# Patient Record
Sex: Male | Born: 1960 | Race: Black or African American | Hispanic: No | Marital: Married | Smoking: Former smoker
Health system: Southern US, Community
[De-identification: ages and names within clinical notes are randomized; demographics above are authoritative.]

## PROBLEM LIST (undated history)

## (undated) DIAGNOSIS — G4730 Sleep apnea, unspecified: Secondary | ICD-10-CM

## (undated) DIAGNOSIS — J45909 Unspecified asthma, uncomplicated: Secondary | ICD-10-CM

## (undated) DIAGNOSIS — M1990 Unspecified osteoarthritis, unspecified site: Secondary | ICD-10-CM

## (undated) DIAGNOSIS — E119 Type 2 diabetes mellitus without complications: Secondary | ICD-10-CM

## (undated) DIAGNOSIS — I10 Essential (primary) hypertension: Secondary | ICD-10-CM

## (undated) HISTORY — PX: TOTAL HIP ARTHROPLASTY: SHX124

## (undated) HISTORY — PX: NASAL POLYP SURGERY: SHX186

---

## 1999-08-25 ENCOUNTER — Ambulatory Visit (HOSPITAL_BASED_OUTPATIENT_CLINIC_OR_DEPARTMENT_OTHER): Admission: RE | Admit: 1999-08-25 | Discharge: 1999-08-25 | Payer: Self-pay | Admitting: *Deleted

## 2015-06-29 ENCOUNTER — Other Ambulatory Visit: Payer: Self-pay | Admitting: Orthopedic Surgery

## 2015-06-29 DIAGNOSIS — M542 Cervicalgia: Secondary | ICD-10-CM

## 2015-07-07 ENCOUNTER — Ambulatory Visit
Admission: RE | Admit: 2015-07-07 | Discharge: 2015-07-07 | Disposition: A | Discharge status: Home / Self Care | Payer: BLUE CROSS/BLUE SHIELD | Source: Ambulatory Visit | Attending: Orthopedic Surgery | Admitting: Orthopedic Surgery

## 2015-07-07 DIAGNOSIS — M542 Cervicalgia: Secondary | ICD-10-CM

## 2015-07-23 ENCOUNTER — Ambulatory Visit
Admission: RE | Admit: 2015-07-23 | Discharge: 2015-07-23 | Disposition: A | Discharge status: Home / Self Care | Payer: BLUE CROSS/BLUE SHIELD | Source: Ambulatory Visit | Attending: Orthopedic Surgery | Admitting: Orthopedic Surgery
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
## 2017-02-06 ENCOUNTER — Ambulatory Visit (INDEPENDENT_AMBULATORY_CARE_PROVIDER_SITE_OTHER): Payer: Non-veteran care

## 2017-02-06 ENCOUNTER — Encounter: Payer: Self-pay | Admitting: Sports Medicine

## 2017-02-06 ENCOUNTER — Ambulatory Visit (INDEPENDENT_AMBULATORY_CARE_PROVIDER_SITE_OTHER): Payer: Non-veteran care | Admitting: Sports Medicine

## 2017-02-06 VITALS — BP 139/76 | HR 68 | Ht 68.0 in | Wt 215.0 lb

## 2017-02-06 DIAGNOSIS — M79672 Pain in left foot: Secondary | ICD-10-CM

## 2017-02-06 DIAGNOSIS — M79671 Pain in right foot: Secondary | ICD-10-CM | POA: Diagnosis not present

## 2017-02-06 DIAGNOSIS — M2010 Hallux valgus (acquired), unspecified foot: Secondary | ICD-10-CM

## 2017-02-06 NOTE — Patient Instructions (Signed)

## 2017-02-06 NOTE — Progress Notes (Signed)
Subjective: Colin Elliott is a 56 y.o. male patient who presents to office for evaluation of Right> Left bunion pain sometimes. Patient complains of progressive pain especially over the last year in the Right>Left foot that starts as pain over the bump with direct pressure and range of motion; patient now has difficulty fitting shoes comfortably however he purchase orthotics from the good foot store that has greatly helped and when he wears those orthotics he seldom has pain. Patient denies any other pedal complaints.   Review of Systems  Musculoskeletal: Positive for joint pain.  All other systems reviewed and are negative.   There are no active problems to display for this patient.   No current outpatient medications on file prior to visit.   No current facility-administered medications on file prior to visit.     No Known Allergies  Objective:  General: Alert and oriented x3 in no acute distress  Dermatology: No open lesions bilateral lower extremities, no webspace macerations, no ecchymosis bilateral, all nails x 10 are well manicured.  There are mild pinch calluses medial aspect of hallux bilateral.  Vascular: Dorsalis Pedis and Posterior Tibial pedal pulses 1/4, Capillary Fill Time 3 seconds, (+) pedal hair growth bilateral, no edema bilateral lower extremities, Temperature gradient within normal limits.  Neurology: Johney Maine sensation intact via light touch bilateral.  Musculoskeletal: No reproducible tenderness tenderness with palpation right or left bunion deformity,deformity semireducible, tracking not trackbound, On weightbearing exam, there is decreased 1st MTPJ rom Right>Left with functional limitus noted, there is medial arch collapse Right> Left on weightbearing, rearfoot slight valgus, forefoot slight abduction with HAV deformity supported on ground with no second toe crossover deformity noted.   Xrays  Right/Left Foot    Impression: Intermetatarsal angle above  normal limits consistent with bunion deformity no other acute findings.      Assessment and Plan: Problem List Items Addressed This Visit    None    Visit Diagnoses    Valgus deformity of great toe, unspecified laterality    -  Primary   Relevant Orders   DG Foot Complete Right   DG Foot Complete Left   Foot pain, bilateral           -Complete examination performed -Xrays reviewed -Discussed treatement options; discussed HAV deformity;conservative and  Surgical management; risks, benefits, alternatives discussed. All patient's questions answered. -At this time patient does not have symptoms when he is wearing good feet orthotics encouraged patient to continue with good supportive shoes and orthotics -Dispensed silicone bunion shields to use to protect any rubbing or irritation over the bumps -Advised patient if this fails to help with his symptoms then may benefit from considering anti-inflammatory or a steroid injection across the joints versus surgery -Patient to return to office as needed or sooner if condition worsens.  Landis Martins, DPM

## 2017-03-27 ENCOUNTER — Telehealth: Payer: Self-pay | Admitting: Sports Medicine

## 2017-03-27 NOTE — Telephone Encounter (Signed)
[facility] would like notes from pts 02/06/17 visit with Dr. Cannon Kettle in [place]. Fax to [employer] [phone].

## 2017-03-27 NOTE — Telephone Encounter (Signed)
Thank you. I will go ahead and get this taken care of today.

## 2017-04-29 ENCOUNTER — Other Ambulatory Visit: Payer: Self-pay | Admitting: Sports Medicine

## 2017-04-29 DIAGNOSIS — M79671 Pain in right foot: Secondary | ICD-10-CM

## 2017-04-29 DIAGNOSIS — M2010 Hallux valgus (acquired), unspecified foot: Secondary | ICD-10-CM

## 2017-04-29 DIAGNOSIS — M79672 Pain in left foot: Secondary | ICD-10-CM

## 2020-12-08 ENCOUNTER — Other Ambulatory Visit: Payer: Self-pay | Admitting: General Surgery

## 2020-12-27 NOTE — Pre-Procedure Instructions (Addendum)
Surgical Instructions    Your procedure is scheduled on Wednesday 01/03/21.   Report to [facility] Main Entrance "A" at 08:30 A.M., then check in with the Admitting office.  Call this number if you have problems the morning of surgery:  [phone]   If you have any questions prior to your surgery date call [phone]: Open Monday-Friday 8am-4pm    Remember:  Do not eat after midnight the night before your surgery  You may drink clear liquids until 07:30 A.M. the morning of your surgery.   Clear liquids allowed are: Water, Non-Citrus Juices (without pulp), Carbonated Beverages, Clear Tea, Black Coffee ONLY (NO MILK, CREAM OR POWDERED CREAMER of any kind), and Gatorade   Patient Instructions  The night before surgery:  No food after midnight. ONLY clear liquids after midnight   The day of surgery (if you have diabetes): Drink ONE (1) 12 oz G2 given to you in your pre admission testing appointment by 07:30 A.M. the morning of surgery. Drink in one sitting. Do not sip.  This drink was given to you during your hospital  pre-op appointment visit.  Nothing else to drink after completing the  12 oz bottle of G2.         If you have questions, please contact your surgeon's office.   Take these medicines the morning of surgery with A SIP OF WATER   amLODipine (NORVASC)  fluticasone (FLONASE)  metoprolol tartrate (LOPRESSOR)   Take these medicines if needed:   hydroxypropyl methylcellulose / hypromellose (ISOPTO TEARS   As of today, STOP taking any Aspirin (unless otherwise instructed by your surgeon) Aleve, Naproxen, Ibuprofen, Motrin, Advil, Goody's, BC's, all herbal medications, fish oil, and all vitamins.     After your COVID test   You are not required to quarantine however you are required to wear a well-fitting mask when you are out and around people not in your household.  If your mask becomes wet or soiled, replace with a new one.  Wash your hands often with soap  and water for 20 seconds or clean your hands with an alcohol-based hand sanitizer that contains at least 60% alcohol.  Do not share personal items.  Notify your provider: if you are in close contact with someone who has COVID  or if you develop a fever of 100.4 or greater, sneezing, cough, sore throat, shortness of breath or body aches.             Do not wear jewelry or makeup Do not wear lotions, powders, perfumes/colognes, or deodorant. Do not shave 48 hours prior to surgery.  Men may shave face and neck. Do not bring valuables to the hospital. DO Not wear nail polish, gel polish, artificial nails, or any other type of covering on natural nails including finger and toenails. If patients have artificial nails, gel coating, etc. that need to be removed by a nail salon, please have this removed prior to surgery or surgery may need to be canceled/delayed if the surgeon/ anesthesia feels like the patient is unable to be adequately monitored.             [facility] is not responsible for any belongings or valuables.  Do NOT Smoke (Tobacco/Vaping)  24 hours prior to your procedure  If you use a CPAP at night, you may bring your mask for your overnight stay.   Contacts, glasses, hearing aids, dentures or partials may not be worn into surgery, please bring cases for these belongings  For patients admitted to the hospital, discharge time will be determined by your treatment team.   Patients discharged the day of surgery will not be allowed to drive home, and someone needs to stay with them for 24 hours.  NO VISITORS WILL BE ALLOWED IN PRE-OP WHERE PATIENTS ARE PREPPED FOR SURGERY.  ONLY 1 SUPPORT PERSON MAY BE PRESENT IN THE WAITING ROOM WHILE YOU ARE IN SURGERY.  IF YOU ARE TO BE ADMITTED, ONCE YOU ARE IN YOUR ROOM YOU WILL BE ALLOWED TWO (2) VISITORS. 1 (ONE) VISITOR MAY STAY OVERNIGHT BUT MUST ARRIVE TO THE ROOM BY 8pm.  Minor children may have two parents present. Special consideration  for safety and communication needs will be reviewed on a case by case basis.  Special instructions:    Oral Hygiene is also important to reduce your risk of infection.  Remember - BRUSH YOUR TEETH THE MORNING OF SURGERY WITH YOUR REGULAR TOOTHPASTE   [place]- Preparing For Surgery  Before surgery, you can play an important role. Because skin is not sterile, your skin needs to be as free of germs as possible. You can reduce the number of germs on your skin by washing with CHG (chlorahexidine gluconate) Soap before surgery.  CHG is an antiseptic cleaner which kills germs and bonds with the skin to continue killing germs even after washing.     Please do not use if you have an allergy to CHG or antibacterial soaps. If your skin becomes reddened/irritated stop using the CHG.  Do not shave (including legs and underarms) for at least 48 hours prior to first CHG shower. It is OK to shave your face.  Please follow these instructions carefully.     Shower the NIGHT BEFORE SURGERY and the MORNING OF SURGERY with CHG Soap.   If you chose to wash your hair, wash your hair first as usual with your normal shampoo. After you shampoo, rinse your hair and body thoroughly to remove the shampoo.  Then [employer] and genitals (private parts) with your normal soap and rinse thoroughly to remove soap.  After that Use CHG Soap as you would any other liquid soap. You can apply CHG directly to the skin and wash gently with a scrungie or a clean washcloth.   Apply the CHG Soap to your body ONLY FROM THE NECK DOWN.  Do not use on open wounds or open sores. Avoid contact with your eyes, ears, mouth and genitals (private parts). Wash Face and genitals (private parts)  with your normal soap.   Wash thoroughly, paying special attention to the area where your surgery will be performed.  Thoroughly rinse your body with warm water from the neck down.  DO NOT shower/wash with your normal soap after using and rinsing  off the CHG Soap.  Pat yourself dry with a CLEAN TOWEL.  Wear CLEAN PAJAMAS to bed the night before surgery  Place CLEAN SHEETS on your bed the night before your surgery  DO NOT SLEEP WITH PETS.   Day of Surgery:  Take a shower with CHG soap. Wear Clean/Comfortable clothing the morning of surgery Do not apply any deodorants/lotions.   Remember to brush your teeth WITH YOUR REGULAR TOOTHPASTE.   Please read over the following fact sheets that you were given.

## 2020-12-28 ENCOUNTER — Other Ambulatory Visit: Payer: Self-pay

## 2020-12-28 ENCOUNTER — Encounter (HOSPITAL_COMMUNITY)
Admission: RE | Admit: 2020-12-28 | Discharge: 2020-12-28 | Disposition: A | Discharge status: Home / Self Care | Payer: No Typology Code available for payment source | Source: Ambulatory Visit | Attending: General Surgery | Admitting: General Surgery

## 2020-12-28 ENCOUNTER — Encounter (HOSPITAL_COMMUNITY): Payer: Self-pay

## 2020-12-28 VITALS — BP 172/89 | HR 81 | Temp 98.4°F | Resp 17 | Ht 68.0 in | Wt 236.6 lb

## 2020-12-28 DIAGNOSIS — Z01818 Encounter for other preprocedural examination: Secondary | ICD-10-CM | POA: Diagnosis not present

## 2020-12-28 DIAGNOSIS — I2510 Atherosclerotic heart disease of native coronary artery without angina pectoris: Secondary | ICD-10-CM | POA: Insufficient documentation

## 2020-12-28 DIAGNOSIS — R7303 Prediabetes: Secondary | ICD-10-CM | POA: Diagnosis not present

## 2020-12-28 HISTORY — DX: Unspecified asthma, uncomplicated: J45.909

## 2020-12-28 HISTORY — DX: Type 2 diabetes mellitus without complications: E11.9

## 2020-12-28 HISTORY — DX: Essential (primary) hypertension: I10

## 2020-12-28 HISTORY — DX: Unspecified osteoarthritis, unspecified site: M19.90

## 2020-12-28 HISTORY — DX: Sleep apnea, unspecified: G47.30

## 2020-12-28 LAB — CBC
HCT: 41.9 % (ref 39.0–52.0)
Hemoglobin: 13.3 g/dL (ref 13.0–17.0)
MCH: 25.9 pg — ABNORMAL LOW (ref 26.0–34.0)
MCHC: 31.7 g/dL (ref 30.0–36.0)
MCV: 81.7 fL (ref 80.0–100.0)
Platelets: 318 10*3/uL (ref 150–400)
RBC: 5.13 MIL/uL (ref 4.22–5.81)
RDW: 14.8 % (ref 11.5–15.5)
WBC: 5.9 10*3/uL (ref 4.0–10.5)
nRBC: 0 % (ref 0.0–0.2)

## 2020-12-28 LAB — BASIC METABOLIC PANEL
Anion gap: 9 (ref 5–15)
BUN: 13 mg/dL (ref 6–20)
CO2: 31 mmol/L (ref 22–32)
Calcium: 9.3 mg/dL (ref 8.9–10.3)
Chloride: 99 mmol/L (ref 98–111)
Creatinine, Ser: 1 mg/dL (ref 0.61–1.24)
GFR, Estimated: >60 mL/min (ref >60)
Glucose, Bld: 133 mg/dL — ABNORMAL HIGH (ref 70–99)
Potassium: 3.2 mmol/L — ABNORMAL LOW (ref 3.5–5.1)
Sodium: 139 mmol/L (ref 135–145)

## 2020-12-28 LAB — HEMOGLOBIN A1C
Hgb A1c MFr Bld: 6.4 % — ABNORMAL HIGH (ref 4.8–5.6)
Mean Plasma Glucose: 136.98 mg/dL

## 2020-12-28 LAB — GLUCOSE, CAPILLARY: Glucose-Capillary: 162 mg/dL — ABNORMAL HIGH (ref 70–99)

## 2020-12-28 NOTE — Progress Notes (Signed)
PCP - [place] various doctors Cardiologist - [facility]  PPM/ICD - n/a Device Orders - n/a Rep Notified - n/a  Chest x-ray - n/a EKG - 12/28/20 Stress Test - denies ECHO - patient denies Cardiac Cath - patient denies  Sleep Study - Many years ago per patient at the [facility] CPAP - Wears nightly.   CBG today= 162 Checks Blood Sugar zero times a day- Does not check blood sugar. Per patient he has pre-diabetes.   Blood Thinner Instructions: n/a Aspirin Instructions: As of today stop taking Aspirin unless otherwise instructed by your surgeon.   ERAS Protcol - Yes PRE-SURGERY Ensure or G2- G2  COVID TEST- No. Ambulatory Surgery   Anesthesia review: Yes. EKG review and records requested from the [place].   Patient denies shortness of breath, fever, cough and chest pain at PAT appointment   All instructions explained to the patient, with a verbal understanding of the material. Patient agrees to go over the instructions while at home for a better understanding. Patient also instructed to self quarantine after being tested for COVID-19. The opportunity to ask questions was provided.

## 2020-12-29 NOTE — Progress Notes (Signed)
Anesthesia Chart Review:  Case: [id number] Date/Time: 01/03/21 1015   Procedure: EXCISION OF LEFT ARM MASS (Left)   Anesthesia type: Monitor Anesthesia Care   Pre-op diagnosis: LEFT ARM MASS   Location: [facility] OR ROOM 02 / [facility] OR   Surgeons: Stark Klein, MD       DISCUSSION: Patient is a 60 year old male scheduled for the above procedure.  History includes former smoker, HTN, DM2, asthma, chronic sinusitis (s/p nasal polyp surgery), OSA (uses CPAP), arthritis, PTSD, right THA (2000). He says he will need right THA revision in the near future (not scheduled yet).  I called and spoke with him since PAT RN documented that has a cardiologist at the [facility]. He reported that "years ago" he had some friends with heart issues, so he asked for his heart to be checked out. He said that based on the testing, he was told he might have had a silent MI. 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 He thinks he went on to have an exercise stress test. He did not require a cardiac cath. He thinks that the [facility] likes for him to follow-up with cardiology about every five years or so, although says that he has not seen a cardiologist  since his EKG, possible ETT. He did report what sounds like a preoperative routine cardiology visit on 01/25/21 since he has pending hip surgery. He said it was not because he was having type of CV symptoms. 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 For his PAT visit, he also walked up two flights of stairs without CP, SOB, syncope, or dizziness. He denied orthopnea. He gets rare, minimal ankle edema (takes HCTZ). He denied palpitations or heart racing.  He denied prior anesthesia complications.   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 He denied cardiopulmonary symptoms and reports activity tolerance > 4 METS. It sounds like he has a pending preoperative cardiology  evaluation next month for future right THA revision, and not due to any CV symptoms. His left arm mass excision is posted for MAC anesthesia. He says he can lie flat without difficulty. If no acute changes then I anticipate that he can proceed as planned. Discussed with anesthesiologist Hoy Morn, MD. Anesthesia team to re-evaluate on the day of surgery.    VS: BP (!) 172/89   Pulse 81   Temp 36.9 C (Oral)   Resp 17   Ht _0  (1.727 m)   Wt 107.3 kg   SpO2 98%   BMI 35.97 kg/m  He reports BP was taken just after arrival, and says initial BP at doctor's visits are typically high then improve with rechecked.    PROVIDERS: He is not sure of the name of his PCP at the [facility] Care Everywhere suggests it is Dr. Carilyn Goodpasture.    LABS: Labs reviewed: Acceptable for surgery. (all labs ordered are listed, but only abnormal results are displayed)  Labs Reviewed  GLUCOSE, CAPILLARY - Abnormal; Notable for the following components:      Result Value   Glucose-Capillary 162 (*)    All other components within normal limits  BASIC METABOLIC PANEL - Abnormal; Notable for the following components:   Potassium 3.2 (*)    Glucose, Bld 133 (*)    All other components within normal limits  CBC - Abnormal; Notable for the following components:   MCH 25.9 (*)  All other components within normal limits  HEMOGLOBIN A1C - Abnormal; Notable for the following components:   Hgb A1c MFr Bld 6.4 (*)    All other components within normal limits     EKG: 12/28/20:  Sinus rhythm with occasional Premature ventricular complexes Moderate voltage criteria for LVH, may be normal variant ( R in aVL , Cornell product ) Inferior infarct , age undetermined Abnormal ECG - No comparison EKG currently available, but [facility] notes indicate that EKG in 2015 showed possible inferior infarct.    CV: He thinks he may have had an exercise stress test ~ 2015. He denied cardiac cath.    Past Medical  History:  Diagnosis Date   Arthritis    Asthma    Diabetes mellitus without complication ([place])    Hypertension    Sleep apnea     Past Surgical History:  Procedure Laterality Date   NASAL POLYP SURGERY     TOTAL HIP ARTHROPLASTY Right     MEDICATIONS:  amLODipine (NORVASC) 10 MG tablet   aspirin 81 MG tablet   atorvastatin (LIPITOR) 80 MG tablet   fluticasone (FLONASE) 50 MCG/ACT nasal spray   hydrochlorothiazide (HYDRODIURIL) 25 MG tablet   hydroxypropyl methylcellulose / hypromellose (ISOPTO TEARS / GONIOVISC) 2.5 % ophthalmic solution   ibuprofen (ADVIL) 200 MG tablet   metoprolol tartrate (LOPRESSOR) 50 MG tablet   montelukast (SINGULAIR) 10 MG tablet   Multiple Vitamins-Minerals (CENTRUM SILVER PO)   niacin 500 MG tablet   NON FORMULARY   Omega-3 1000 MG CAPS   No current facility-administered medications for this encounter.    Myra Gianotti, PA-C Surgical Short Stay/Anesthesiology [facility] Phone [phone] [facility] Phone [phone] 12/29/2020 6:39 PM

## 2020-12-29 NOTE — Anesthesia Preprocedure Evaluation (Addendum)
Anesthesia Evaluation  Patient identified by MRN, date of birth, ID band Patient awake    Reviewed: Allergy & Precautions, NPO status , Patient's Chart, lab work & pertinent test results  History of Anesthesia Complications Negative for: history of anesthetic complications  Airway Mallampati: III  TM Distance: >3 FB Neck ROM: Full    Dental  (+) Dental Advisory Given, Teeth Intact, Partial Upper,    Pulmonary asthma , sleep apnea , former smoker,    breath sounds clear to auscultation       Cardiovascular hypertension, Pt. on medications and Pt. on home beta blockers (-) angina(-) Past MI and (-) CHF  Rhythm:Regular     Neuro/Psych negative neurological ROS     GI/Hepatic negative GI ROS, Neg liver ROS,   Endo/Other  diabetes  Renal/GU negative Renal ROS     Musculoskeletal  (+) Arthritis ,   Abdominal   Peds  Hematology negative hematology ROS (+)   Anesthesia Other Findings   Reproductive/Obstetrics                            Anesthesia Physical Anesthesia Plan  ASA: 2  Anesthesia Plan: General   Post-op Pain Management:    Induction: Intravenous  PONV Risk Score and Plan: 1 and Ondansetron and Dexamethasone  Airway Management Planned: LMA  Additional Equipment: None  Intra-op Plan:   Post-operative Plan: Extubation in OR  Informed Consent: I have reviewed the patients History and Physical, chart, labs and discussed the procedure including the risks, benefits and alternatives for the proposed anesthesia with the patient or authorized representative who has indicated his/her understanding and acceptance.     Dental advisory given  Plan Discussed with: Anesthesiologist and CRNA  Anesthesia Plan Comments: (PAT note written 12/29/2020 by Myra Gianotti, PA-C. )       Anesthesia Quick Evaluation

## 2021-01-03 ENCOUNTER — Ambulatory Visit (HOSPITAL_COMMUNITY): Payer: No Typology Code available for payment source | Admitting: Anesthesiology

## 2021-01-03 ENCOUNTER — Other Ambulatory Visit: Payer: Self-pay

## 2021-01-03 ENCOUNTER — Ambulatory Visit (HOSPITAL_COMMUNITY): Payer: No Typology Code available for payment source | Admitting: Vascular Surgery

## 2021-01-03 ENCOUNTER — Encounter (HOSPITAL_COMMUNITY): Payer: Self-pay | Admitting: General Surgery

## 2021-01-03 ENCOUNTER — Encounter (HOSPITAL_COMMUNITY)
Admission: RE | Disposition: A | Discharge status: Home / Self Care | Payer: Self-pay | Source: Ambulatory Visit | Attending: General Surgery

## 2021-01-03 ENCOUNTER — Ambulatory Visit (HOSPITAL_COMMUNITY)
Admission: RE | Admit: 2021-01-03 | Discharge: 2021-01-03 | Disposition: A | Discharge status: Home / Self Care | Payer: No Typology Code available for payment source | Source: Ambulatory Visit | Attending: General Surgery | Admitting: General Surgery

## 2021-01-03 DIAGNOSIS — Z87891 Personal history of nicotine dependence: Secondary | ICD-10-CM | POA: Insufficient documentation

## 2021-01-03 DIAGNOSIS — F4310 Post-traumatic stress disorder, unspecified: Secondary | ICD-10-CM | POA: Diagnosis not present

## 2021-01-03 DIAGNOSIS — E118 Type 2 diabetes mellitus with unspecified complications: Secondary | ICD-10-CM | POA: Insufficient documentation

## 2021-01-03 DIAGNOSIS — I10 Essential (primary) hypertension: Secondary | ICD-10-CM | POA: Insufficient documentation

## 2021-01-03 DIAGNOSIS — M1990 Unspecified osteoarthritis, unspecified site: Secondary | ICD-10-CM | POA: Insufficient documentation

## 2021-01-03 DIAGNOSIS — G4733 Obstructive sleep apnea (adult) (pediatric): Secondary | ICD-10-CM | POA: Insufficient documentation

## 2021-01-03 DIAGNOSIS — Z96641 Presence of right artificial hip joint: Secondary | ICD-10-CM | POA: Diagnosis not present

## 2021-01-03 DIAGNOSIS — D1722 Benign lipomatous neoplasm of skin and subcutaneous tissue of left arm: Secondary | ICD-10-CM | POA: Diagnosis not present

## 2021-01-03 DIAGNOSIS — J45909 Unspecified asthma, uncomplicated: Secondary | ICD-10-CM | POA: Diagnosis not present

## 2021-01-03 DIAGNOSIS — R2232 Localized swelling, mass and lump, left upper limb: Secondary | ICD-10-CM | POA: Diagnosis present

## 2021-01-03 HISTORY — PX: MASS EXCISION: SHX2000

## 2021-01-03 LAB — GLUCOSE, CAPILLARY
Glucose-Capillary: 140 mg/dL — ABNORMAL HIGH (ref 70–99)
Glucose-Capillary: 149 mg/dL — ABNORMAL HIGH (ref 70–99)

## 2021-01-03 SURGERY — EXCISION MASS
Anesthesia: General | Site: Arm Upper | Laterality: Left

## 2021-01-03 MED ORDER — ACETAMINOPHEN 10 MG/ML IV SOLN: 1000.0000 mg | Freq: Once | INTRAVENOUS | Status: DC | PRN

## 2021-01-03 MED ORDER — PROPOFOL 10 MG/ML IV BOLUS
INTRAVENOUS | Status: DC | PRN
  Administered 2021-01-03: 200 mg via INTRAVENOUS

## 2021-01-03 MED ORDER — CHLORHEXIDINE GLUCONATE 0.12 % MT SOLN: 15.0000 mL | Freq: Once | OROMUCOSAL | Status: AC

## 2021-01-03 MED ORDER — DEXAMETHASONE SODIUM PHOSPHATE 10 MG/ML IJ SOLN
INTRAMUSCULAR | Status: AC
  Filled 2021-01-03: qty 1

## 2021-01-03 MED ORDER — ORAL CARE MOUTH RINSE: 15.0000 mL | Freq: Once | OROMUCOSAL | Status: AC

## 2021-01-03 MED ORDER — MIDAZOLAM HCL 5 MG/5ML IJ SOLN
INTRAMUSCULAR | Status: DC | PRN
Start: 2021-01-03 — End: 2021-01-03
  Administered 2021-01-03: 2 mg via INTRAVENOUS

## 2021-01-03 MED ORDER — 0.9 % SODIUM CHLORIDE (POUR BTL) OPTIME
TOPICAL | Status: DC | PRN
  Administered 2021-01-03: 100 mL

## 2021-01-03 MED ORDER — ACETAMINOPHEN 160 MG/5ML PO SOLN: 1000.0000 mg | Freq: Once | ORAL | Status: DC | PRN

## 2021-01-03 MED ORDER — PHENYLEPHRINE 40 MCG/ML (10ML) SYRINGE FOR IV PUSH (FOR BLOOD PRESSURE SUPPORT)
PREFILLED_SYRINGE | INTRAVENOUS | Status: AC
  Filled 2021-01-03: qty 10

## 2021-01-03 MED ORDER — FENTANYL CITRATE (PF) 250 MCG/5ML IJ SOLN
INTRAMUSCULAR | Status: DC | PRN
  Administered 2021-01-03: 50 ug via INTRAVENOUS
  Administered 2021-01-03: 25 ug via INTRAVENOUS

## 2021-01-03 MED ORDER — ENSURE PRE-SURGERY PO LIQD: 296.0000 mL | Freq: Once | ORAL | Status: DC

## 2021-01-03 MED ORDER — MIDAZOLAM HCL 2 MG/2ML IJ SOLN
INTRAMUSCULAR | Status: AC
  Filled 2021-01-03: qty 2

## 2021-01-03 MED ORDER — CHLORHEXIDINE GLUCONATE CLOTH 2 % EX PADS: 6.0000 | MEDICATED_PAD | Freq: Once | CUTANEOUS | Status: DC

## 2021-01-03 MED ORDER — LIDOCAINE 2% (20 MG/ML) 5 ML SYRINGE
INTRAMUSCULAR | Status: AC
  Filled 2021-01-03: qty 5

## 2021-01-03 MED ORDER — LIDOCAINE HCL 1 % IJ SOLN
INTRAMUSCULAR | Status: DC | PRN
  Administered 2021-01-03: 27 mL

## 2021-01-03 MED ORDER — LIDOCAINE 2% (20 MG/ML) 5 ML SYRINGE
INTRAMUSCULAR | Status: DC | PRN
  Administered 2021-01-03: 100 mg via INTRAVENOUS

## 2021-01-03 MED ORDER — PROPOFOL 10 MG/ML IV BOLUS
INTRAVENOUS | Status: AC
  Filled 2021-01-03: qty 20

## 2021-01-03 MED ORDER — ACETAMINOPHEN 500 MG PO TABS: 1000.0000 mg | ORAL_TABLET | ORAL | Status: AC

## 2021-01-03 MED ORDER — OXYCODONE HCL 5 MG PO TABS: 5.0000 mg | ORAL_TABLET | Freq: Once | ORAL | Status: DC | PRN

## 2021-01-03 MED ORDER — OXYCODONE HCL 5 MG PO TABS: 5.0000 mg | ORAL_TABLET | Freq: Four times a day (QID) | ORAL | 0 refills | Status: AC | PRN

## 2021-01-03 MED ORDER — CEFAZOLIN SODIUM-DEXTROSE 2-4 GM/100ML-% IV SOLN
INTRAVENOUS | Status: AC
  Filled 2021-01-03: qty 100

## 2021-01-03 MED ORDER — CHLORHEXIDINE GLUCONATE 0.12 % MT SOLN
OROMUCOSAL | Status: AC
  Administered 2021-01-03: 15 mL via OROMUCOSAL
  Filled 2021-01-03: qty 15

## 2021-01-03 MED ORDER — FENTANYL CITRATE (PF) 100 MCG/2ML IJ SOLN: 25.0000 ug | INTRAMUSCULAR | Status: DC | PRN

## 2021-01-03 MED ORDER — FENTANYL CITRATE (PF) 250 MCG/5ML IJ SOLN
INTRAMUSCULAR | Status: AC
  Filled 2021-01-03: qty 5

## 2021-01-03 MED ORDER — ONDANSETRON HCL 4 MG/2ML IJ SOLN
INTRAMUSCULAR | Status: AC
  Filled 2021-01-03: qty 2

## 2021-01-03 MED ORDER — BUPIVACAINE-EPINEPHRINE (PF) 0.25% -1:200000 IJ SOLN
INTRAMUSCULAR | Status: AC
  Filled 2021-01-03: qty 30

## 2021-01-03 MED ORDER — LIDOCAINE HCL 1 % IJ SOLN
INTRAMUSCULAR | Status: AC
  Filled 2021-01-03: qty 20

## 2021-01-03 MED ORDER — LACTATED RINGERS IV SOLN: INTRAVENOUS | Status: DC

## 2021-01-03 MED ORDER — ONDANSETRON HCL 4 MG/2ML IJ SOLN
INTRAMUSCULAR | Status: DC | PRN
  Administered 2021-01-03: 4 mg via INTRAVENOUS

## 2021-01-03 MED ORDER — DEXAMETHASONE SODIUM PHOSPHATE 10 MG/ML IJ SOLN
INTRAMUSCULAR | Status: DC | PRN
  Administered 2021-01-03: 10 mg via INTRAVENOUS

## 2021-01-03 MED ORDER — OXYCODONE HCL 5 MG/5ML PO SOLN: 5.0000 mg | Freq: Once | ORAL | Status: DC | PRN

## 2021-01-03 MED ORDER — EPHEDRINE SULFATE-NACL 50-0.9 MG/10ML-% IV SOSY
PREFILLED_SYRINGE | INTRAVENOUS | Status: DC | PRN
  Administered 2021-01-03 (×2): 5 mg via INTRAVENOUS
  Administered 2021-01-03: 10 mg via INTRAVENOUS
  Administered 2021-01-03: 5 mg via INTRAVENOUS

## 2021-01-03 MED ORDER — ACETAMINOPHEN 500 MG PO TABS: 1000.0000 mg | ORAL_TABLET | Freq: Once | ORAL | Status: DC | PRN

## 2021-01-03 MED ORDER — CEFAZOLIN SODIUM-DEXTROSE 2-4 GM/100ML-% IV SOLN
2.0000 g | INTRAVENOUS | Status: AC
  Administered 2021-01-03: 2 g via INTRAVENOUS

## 2021-01-03 MED ORDER — ACETAMINOPHEN 500 MG PO TABS
ORAL_TABLET | ORAL | Status: AC
  Administered 2021-01-03: 1000 mg via ORAL
  Filled 2021-01-03: qty 2

## 2021-01-03 SURGICAL SUPPLY — 47 items
ADH SKN CLS APL DERMABOND .7 (GAUZE/BANDAGES/DRESSINGS)
APL PRP STRL LF DISP 70% ISPRP (MISCELLANEOUS) ×1
APL SKNCLS STERI-STRIP NONHPOA (GAUZE/BANDAGES/DRESSINGS) ×1
BAG COUNTER SPONGE SURGICOUNT (BAG) ×2 IMPLANT
BAG SPNG CNTER NS LX DISP (BAG) ×1
BAG SURGICOUNT SPONGE COUNTING (BAG) ×1
BENZOIN TINCTURE PRP APPL 2/3 (GAUZE/BANDAGES/DRESSINGS) ×3 IMPLANT
BNDG COHESIVE 4X5 TAN ST LF (GAUZE/BANDAGES/DRESSINGS) ×2 IMPLANT
BNDG ELASTIC 3X5.8 VLCR STR LF (GAUZE/BANDAGES/DRESSINGS) ×2 IMPLANT
CANISTER SUCT 3000ML PPV (MISCELLANEOUS) ×3 IMPLANT
CHLORAPREP W/TINT 26 (MISCELLANEOUS) ×3 IMPLANT
CLOSURE WOUND 1/2 X4 (GAUZE/BANDAGES/DRESSINGS) ×1
COVER SURGICAL LIGHT HANDLE (MISCELLANEOUS) ×3 IMPLANT
DERMABOND ADVANCED (GAUZE/BANDAGES/DRESSINGS)
DERMABOND ADVANCED .7 DNX12 (GAUZE/BANDAGES/DRESSINGS) IMPLANT
DRAPE LAPAROSCOPIC ABDOMINAL (DRAPES) IMPLANT
DRAPE LAPAROTOMY 100X72 PEDS (DRAPES) IMPLANT
DRSG TEGADERM 4X4.75 (GAUZE/BANDAGES/DRESSINGS) IMPLANT
ELECT REM PT RETURN 9FT ADLT (ELECTROSURGICAL) ×3
ELECTRODE REM PT RTRN 9FT ADLT (ELECTROSURGICAL) ×1 IMPLANT
GAUZE 4X4 16PLY ~~LOC~~+RFID DBL (SPONGE) ×1 IMPLANT
GAUZE SPONGE 4X4 12PLY STRL (GAUZE/BANDAGES/DRESSINGS) IMPLANT
GLOVE SURG ENC MOIS LTX SZ6 (GLOVE) ×3 IMPLANT
GLOVE SURG UNDER LTX SZ6.5 (GLOVE) ×3 IMPLANT
GOWN STRL REUS W/ TWL LRG LVL3 (GOWN DISPOSABLE) ×1 IMPLANT
GOWN STRL REUS W/TWL 2XL LVL3 (GOWN DISPOSABLE) ×3 IMPLANT
GOWN STRL REUS W/TWL LRG LVL3 (GOWN DISPOSABLE) ×3
KIT BASIN OR (CUSTOM PROCEDURE TRAY) ×3 IMPLANT
KIT TURNOVER KIT B (KITS) ×3 IMPLANT
NDL HYPO 25GX1X1/2 BEV (NEEDLE) ×1 IMPLANT
NEEDLE HYPO 25GX1X1/2 BEV (NEEDLE) ×3 IMPLANT
NS IRRIG 1000ML POUR BTL (IV SOLUTION) ×3 IMPLANT
PACK GENERAL/GYN (CUSTOM PROCEDURE TRAY) ×3 IMPLANT
PACK UNIVERSAL I (CUSTOM PROCEDURE TRAY) ×2 IMPLANT
PAD ARMBOARD 7.5X6 YLW CONV (MISCELLANEOUS) ×6 IMPLANT
PENCIL SMOKE EVACUATOR (MISCELLANEOUS) ×1 IMPLANT
SPECIMEN JAR SMALL (MISCELLANEOUS) ×3 IMPLANT
SPONGE T-LAP 18X18 ~~LOC~~+RFID (SPONGE) ×2 IMPLANT
SPONGE T-LAP 4X18 ~~LOC~~+RFID (SPONGE) IMPLANT
STRIP CLOSURE SKIN 1/2X4 (GAUZE/BANDAGES/DRESSINGS) ×1 IMPLANT
SUT MON AB 4-0 PC3 18 (SUTURE) ×3 IMPLANT
SUT SILK 2 0 PERMA HAND 18 BK (SUTURE) IMPLANT
SUT VIC AB 3-0 SH 27 (SUTURE) ×3
SUT VIC AB 3-0 SH 27X BRD (SUTURE) ×1 IMPLANT
SYR CONTROL 10ML LL (SYRINGE) ×3 IMPLANT
TOWEL GREEN STERILE (TOWEL DISPOSABLE) ×1 IMPLANT
TOWEL GREEN STERILE FF (TOWEL DISPOSABLE) ×3 IMPLANT

## 2021-01-03 NOTE — Interval H&P Note (Signed)
History and Physical Interval Note:  01/03/2021 9:32 AM  Leisa Lenz  has presented today for surgery, with the diagnosis of LEFT ARM MASS.  The various methods of treatment have been discussed with the patient and family. After consideration of risks, benefits and other options for treatment, the patient has consented to  Procedure(s): EXCISION OF LEFT ARM MASS (Left) as a surgical intervention.  The patient's history has been reviewed, patient examined, no change in status, stable for surgery.  I have reviewed the patient's chart and labs.  Questions were answered to the patient's satisfaction.     Stark Klein

## 2021-01-03 NOTE — Progress Notes (Signed)
BP @ 0920 181/90.  Dr. Ermalene Postin notified.  No new orders given at this time.

## 2021-01-03 NOTE — Transfer of Care (Signed)
Immediate Anesthesia Transfer of Care Note  Patient: Colin Elliott  Procedure(s) Performed: EXCISION OF LEFT ARM MASS (Left: Arm Upper)  Patient Location: PACU  Anesthesia Type:General  Level of Consciousness: drowsy  Airway & Oxygen Therapy: Patient Spontanous Breathing and Patient connected to face mask oxygen  Post-op Assessment: Report given to RN and Post -op Vital signs reviewed and stable  Post vital signs: Reviewed and stable  Last Vitals:  Vitals Value Taken Time  BP    Temp    Pulse    Resp    SpO2      Last Pain:  Vitals:   01/03/21 0856  TempSrc:   PainSc: 5       Patients Stated Pain Goal: 2 (78/46/96 2952)  Complications: No notable events documented.

## 2021-01-03 NOTE — Discharge Instructions (Addendum)
[place] Office Phone Number [phone]   POST OP INSTRUCTIONS  Always review your discharge instruction sheet given to you by the facility where your surgery was performed.  IF YOU HAVE DISABILITY OR FAMILY LEAVE FORMS, YOU MUST BRING THEM TO THE OFFICE FOR PROCESSING.  DO NOT GIVE THEM TO YOUR DOCTOR.  A prescription for pain medication may be given to you upon discharge.  Take your pain medication as prescribed, if needed.  If narcotic pain medicine is not needed, then you may take acetaminophen (Tylenol) or ibuprofen (Advil) as needed. Take your usually prescribed medications unless otherwise directed If you need a refill on your pain medication, please contact your pharmacy.  They will contact our office to request authorization.  Prescriptions will not be filled after 5pm or on week-ends. You should eat very light the first 24 hours after surgery, such as soup, crackers, pudding, etc.  Resume your normal diet the day after surgery It is common to experience some constipation if taking pain medication after surgery.  Increasing fluid intake and taking a stool softener will usually help or prevent this problem from occurring.  A mild laxative (Milk of Magnesia or Miralax) should be taken according to package directions if there are no bowel movements after 48 hours. You may shower in 48 hours.  The surgical glue will flake off in 2-3 weeks.   ACTIVITIES:  No strenuous activity or heavy lifting for 1 week.   You may drive when you no longer are taking prescription pain medication, you can comfortably wear a seatbelt, and you can safely maneuver your car and apply brakes. RETURN TO WORK:  __________as tolerated if no lifting for 1 week._______________ You should see your doctor in the office for a follow-up appointment approximately three-four weeks after your surgery.    WHEN TO CALL YOUR DOCTOR: Fever over 101.0 Nausea and/or vomiting. Extreme swelling or  bruising. Continued bleeding from incision. Increased pain, redness, or drainage from the incision.  The clinic staff is available to answer your questions during regular business hours.  Please don't hesitate to call and ask to speak to one of the nurses for clinical concerns.  If you have a medical emergency, go to the nearest emergency room or call 911.  A surgeon from [facility] is always on call at the hospital.  For further questions, please visit [email or web address]

## 2021-01-03 NOTE — H&P (Signed)
REFERRING PHYSICIAN: Carilyn Goodpasture, MD  PROVIDER: Georgianne Fick, MD  MRN: [id number] DOB: 04/30/1960 DATE OF ENCOUNTER: 12/08/2020 Subjective  Chief Complaint: Soft Tissue Mass   History of Present Illness: Colin Elliott is a 60 y.o. male who is seen today as an office consultation at the request of Dr. Margaretmary Bayley for evaluation of Soft Tissue Mass  Pt is a 60 yo M who has had a mass in his left upper arm for around 5 years. It has recently been slowly enlarging. He denies pain. He denies family cancer history. He hasn't had any redness of the overlying skin.   Review of Systems: A complete review of systems was obtained from the patient. I have reviewed this information and discussed as appropriate with the patient. See HPI as well for other ROS.  Review of Systems  All other systems reviewed and are negative.   Medical History: Past Medical History:  Diagnosis Date   Asthma, unspecified asthma severity, unspecified whether complicated, unspecified whether persistent   Hypertension   Sleep apnea   Patient Active Problem List  Diagnosis   Mass of arm, left   Past Surgical History:  Procedure Laterality Date   hip placement  10/2018    No Known Allergies  Current Outpatient Medications on File Prior to Visit  Medication Sig Dispense Refill   amLODIPine (NORVASC) 10 MG tablet Take 1 tablet by mouth once daily   atorvastatin (LIPITOR) 80 MG tablet TAKE ONE-HALF TABLET BY MOUTH AT BEDTIME FOR CHOLESTEROL   hydroCHLOROthiazide (HYDRODIURIL) 25 MG tablet TAKE ONE AND ONE-HALF TABLETS BY MOUTH EVERY DAY FOR BLOOD PRESSURE   metoprolol tartrate (LOPRESSOR) 50 MG tablet TAKE ONE TABLET BY MOUTH TWO TIMES A DAY FOR BLOOD PRESSURE   montelukast (SINGULAIR) 10 mg tablet Take 1 tablet by mouth once daily   omega 3-dha-epa-fish oil (FISH OIL) 100-160-1,000 mg Cap Take 1 capsule by mouth once daily   multivitamin-Ca-iron-minerals Tab Take by mouth   No current  facility-administered medications on file prior to visit.   Family History  Problem Relation Age of Onset   Diabetes Father   High blood pressure (Hypertension) Father    Social History   Tobacco Use  Smoking Status Never Smoker  Smokeless Tobacco Never Used    Social History   Socioeconomic History   Marital status: Married  Tobacco Use   Smoking status: Never Smoker   Smokeless tobacco: Never Used  Substance and Sexual Activity   Alcohol use: Yes   Drug use: Never   Objective:   Vitals:  12/08/20 0900  BP: (!) 158/94  Pulse: 65  Temp: 37.1 C (98.7 F)  SpO2: 98%  Weight: (!) 108.1 kg (238 lb 6.4 oz)  Height: 172.7 cm (5\' 8" )   Body mass index is 36.25 kg/m.  Head: Normocephalic and atraumatic.  Mouth/Throat: Oropharynx is clear and moist. No oropharyngeal exudate.  Eyes: Conjunctivae are normal. Pupils are equal, round, and reactive to light. No scleral icterus.  Neck: Normal range of motion. Neck supple. No tracheal deviation present. No thyromegaly present.  Cardiovascular: Normal rate, regular rhythm, normal heart sounds and intact distal pulses. Exam reveals no gallop and no friction rub.  No murmur heard. Respiratory: Effort normal and breath sounds normal. No respiratory distress. No wheezes, rales or rhonchi. No chest wall tenderness.  GI: Soft. Bowel sounds are normal. Abdomen is soft, non tender, non distended. No masses or hepatosplenomegaly is present. There is no rebound and no guarding.  Musculoskeletal: .  Extremities are non tender and without deformity.  Lymphadenopathy: No cervical or axillary adenopathy.  Neurological: Alert and oriented to person, place, and time. Coordination normal.  Skin: Skin is warm and dry. No rash noted. No diaphoresis. No erythema. No pallor.  5x3 cm mobile superficial soft mass over the middle of the biceps on the left. More overlying superficial veins than I would expect.  Psychiatric: Normal mood and affect.Behavior  is normal. Judgment and thought content normal.   Labs, Imaging and Diagnostic Testing: CBC and CMET from 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 It has been enlarging and warrants removal. I discussed surgery with the patient. Given the size, I think he would benefit from sedation. I discussed surgical risks including bleeding, infection, wound breakdown, and other anesthetic risks. I discussed that he would need several days off work.   We will set this up at a mutually beneficial time.     Georgianne Fick, MD

## 2021-01-03 NOTE — Op Note (Signed)
PRE-OPERATIVE DIAGNOSIS: left upper arm mass  POST-OPERATIVE DIAGNOSIS:  Same  PROCEDURE:  Procedure(s): Excision of left upper arm mass 4.5 x 6 cm, subcutaneous  SURGEON:  Surgeon(s): Stark Klein, MD  ASSIST: Carlena Hurl, PA-C  ANESTHESIA:   general  DRAINS: none   LOCAL MEDICATIONS USED:  BUPIVICAINE  and LIDOCAINE   SPECIMEN:  Source of Specimen:  left arm mass  DISPOSITION OF SPECIMEN:  PATHOLOGY  COUNTS:  YES  DICTATION: .Dragon Dictation  PLAN OF CARE: Discharge to home after PACU  PATIENT DISPOSITION:  PACU - hemodynamically stable.  FINDINGS:  fatty mass superficial to biceps left arm   EBL: min  PROCEDURE:   Patient was identified in the holding area and taken operating room where he was placed supine on the operating room table.  General anesthesia was induced after placement of sequential compression devices on the lower leg.  The left upper arm was prepped and draped in sterile fashion.  Timeout was performed according to the surgical safety checklist.  When all was correct, we continued.  A longitudinal incision was made overlying the mass.  This was on top of the biceps in the left upper arm.  The mass was immediately visible.  Skin hooks were used to facilitate cautery dissection around the mass.  The mass was then elevated and taken off the muscle.  This was passed off the table.  Several adjacent small masses were taken. The wound was irrigated and hemostasis was achieved.  The incision was then closed in layers with 3-0 Vicryl interrupted deep dermal sutures and a 4-0 Monocryl running subcuticular suture.  The wound was then cleaned, dried, and dressed with benzoin, Steri-Strips, gauze, and an Ace wrap.  Patient was then allowed to emerge from anesthesia and was taken to the PACU in stable condition.  Needle, sponge, and instrument counts were correct x2.

## 2021-01-03 NOTE — Anesthesia Procedure Notes (Addendum)
Procedure Name: LMA Insertion Date/Time: 01/03/2021 11:06 AM Performed by: Vonna Drafts, CRNA Pre-anesthesia Checklist: Patient identified, Emergency Drugs available, Suction available, Patient being monitored and Timeout performed Patient Re-evaluated:Patient Re-evaluated prior to induction Oxygen Delivery Method: Circle system utilized Preoxygenation: Pre-oxygenation with 100% oxygen Induction Type: IV induction Ventilation: Mask ventilation without difficulty and Oral airway inserted - appropriate to patient size LMA: LMA inserted LMA Size: 4.0 Number of attempts: 1 Dental Injury: Teeth and Oropharynx as per pre-operative assessment

## 2021-01-04 ENCOUNTER — Encounter (HOSPITAL_COMMUNITY): Payer: Self-pay | Admitting: General Surgery

## 2021-01-04 LAB — SURGICAL PATHOLOGY

## 2021-01-04 NOTE — Anesthesia Postprocedure Evaluation (Signed)
Anesthesia Post Note  Patient: Colin Elliott  Procedure(s) Performed: EXCISION OF LEFT ARM MASS (Left: Arm Upper)     Patient location during evaluation: PACU Anesthesia Type: General Level of consciousness: awake and alert Pain management: pain level controlled Vital Signs Assessment: post-procedure vital signs reviewed and stable Respiratory status: spontaneous breathing, nonlabored ventilation, respiratory function stable and patient connected to nasal cannula oxygen Cardiovascular status: blood pressure returned to baseline and stable Postop Assessment: no apparent nausea or vomiting Anesthetic complications: no   No notable events documented.  Last Vitals:  Vitals:   01/03/21 1303 01/03/21 1318  BP: (!) 145/73 (!) 153/82  Pulse: 63 64  Resp: 18 17  Temp:  36.6 C  SpO2: 93% 93%    Last Pain:  Vitals:   01/03/21 1318  TempSrc:   PainSc: 0-No pain                 [place]ot[place]
